# Patient Record
Sex: Male | Born: 1955 | Race: White | Hispanic: No | Smoking: Former smoker
Health system: Southern US, Community
[De-identification: ages and names within clinical notes are randomized; demographics above are authoritative.]

## PROBLEM LIST (undated history)

## (undated) DIAGNOSIS — I700 Atherosclerosis of aorta: Secondary | ICD-10-CM

## (undated) DIAGNOSIS — K219 Gastro-esophageal reflux disease without esophagitis: Secondary | ICD-10-CM

## (undated) DIAGNOSIS — J449 Chronic obstructive pulmonary disease, unspecified: Secondary | ICD-10-CM

## (undated) DIAGNOSIS — I4891 Unspecified atrial fibrillation: Secondary | ICD-10-CM

## (undated) DIAGNOSIS — E7800 Pure hypercholesterolemia, unspecified: Secondary | ICD-10-CM

## (undated) DIAGNOSIS — D126 Benign neoplasm of colon, unspecified: Secondary | ICD-10-CM

## (undated) DIAGNOSIS — I4892 Unspecified atrial flutter: Secondary | ICD-10-CM

## (undated) DIAGNOSIS — Z720 Tobacco use: Secondary | ICD-10-CM

## (undated) HISTORY — DX: Atherosclerosis of aorta: I70.0

## (undated) HISTORY — DX: Benign neoplasm of colon, unspecified: D12.6

## (undated) HISTORY — DX: Pure hypercholesterolemia, unspecified: E78.00

## (undated) HISTORY — DX: Tobacco use: Z72.0

## (undated) HISTORY — DX: Chronic obstructive pulmonary disease, unspecified: J44.9

## (undated) HISTORY — DX: Unspecified atrial fibrillation: I48.91

## (undated) HISTORY — DX: Gastro-esophageal reflux disease without esophagitis: K21.9

## (undated) HISTORY — DX: Unspecified atrial flutter: I48.92

---

## 2018-05-19 ENCOUNTER — Emergency Department (HOSPITAL_COMMUNITY): Payer: BLUE CROSS/BLUE SHIELD

## 2018-05-19 ENCOUNTER — Emergency Department (HOSPITAL_COMMUNITY)
Admission: EM | Admit: 2018-05-19 | Discharge: 2018-05-19 | Disposition: A | Discharge status: Home / Self Care | Payer: BLUE CROSS/BLUE SHIELD | Attending: Emergency Medicine | Admitting: Emergency Medicine

## 2018-05-19 ENCOUNTER — Encounter (HOSPITAL_COMMUNITY): Payer: Self-pay | Admitting: Emergency Medicine

## 2018-05-19 ENCOUNTER — Other Ambulatory Visit: Payer: Self-pay

## 2018-05-19 DIAGNOSIS — N200 Calculus of kidney: Secondary | ICD-10-CM | POA: Insufficient documentation

## 2018-05-19 DIAGNOSIS — R1031 Right lower quadrant pain: Secondary | ICD-10-CM | POA: Diagnosis present

## 2018-05-19 LAB — URINALYSIS, ROUTINE W REFLEX MICROSCOPIC
Bilirubin Urine: NEGATIVE
Glucose, UA: NEGATIVE mg/dL
Ketones, ur: NEGATIVE mg/dL
Leukocytes,Ua: NEGATIVE
Nitrite: NEGATIVE
Protein, ur: NEGATIVE mg/dL
Specific Gravity, Urine: 1.006 (ref 1.005–1.030)
pH: 6 (ref 5.0–8.0)

## 2018-05-19 LAB — COMPREHENSIVE METABOLIC PANEL
ALT: 17 U/L (ref 0–44)
AST: 20 U/L (ref 15–41)
Albumin: 4.3 g/dL (ref 3.5–5.0)
Alkaline Phosphatase: 64 U/L (ref 38–126)
Anion gap: 9 (ref 5–15)
BUN: 20 mg/dL (ref 8–23)
CO2: 27 mmol/L (ref 22–32)
Calcium: 9.2 mg/dL (ref 8.9–10.3)
Chloride: 102 mmol/L (ref 98–111)
Creatinine, Ser: 1.61 mg/dL — ABNORMAL HIGH (ref 0.61–1.24)
GFR calc Af Amer: 52 mL/min — ABNORMAL LOW (ref >60)
GFR calc non Af Amer: 45 mL/min — ABNORMAL LOW (ref >60)
Glucose, Bld: 120 mg/dL — ABNORMAL HIGH (ref 70–99)
Potassium: 3.9 mmol/L (ref 3.5–5.1)
Sodium: 138 mmol/L (ref 135–145)
Total Bilirubin: 1.1 mg/dL (ref 0.3–1.2)
Total Protein: 7.2 g/dL (ref 6.5–8.1)

## 2018-05-19 LAB — CBC
HCT: 49.4 % (ref 39.0–52.0)
Hemoglobin: 15.7 g/dL (ref 13.0–17.0)
MCH: 31.4 pg (ref 26.0–34.0)
MCHC: 31.8 g/dL (ref 30.0–36.0)
MCV: 98.8 fL (ref 80.0–100.0)
Platelets: 244 10*3/uL (ref 150–400)
RBC: 5 MIL/uL (ref 4.22–5.81)
RDW: 14.3 % (ref 11.5–15.5)
WBC: 11.3 10*3/uL — ABNORMAL HIGH (ref 4.0–10.5)
nRBC: 0 % (ref 0.0–0.2)

## 2018-05-19 LAB — LIPASE, BLOOD: Lipase: 31 U/L (ref 11–51)

## 2018-05-19 MED ORDER — TAMSULOSIN HCL 0.4 MG PO CAPS: 0.4000 mg | ORAL_CAPSULE | Freq: Every day | ORAL | 0 refills | Status: DC

## 2018-05-19 MED ORDER — SODIUM CHLORIDE 0.9% FLUSH
3.0000 mL | Freq: Once | INTRAVENOUS | Status: AC
  Administered 2018-05-19: 3 mL via INTRAVENOUS

## 2018-05-19 MED ORDER — SODIUM CHLORIDE 0.9 % IV BOLUS
1000.0000 mL | Freq: Once | INTRAVENOUS | Status: AC
  Administered 2018-05-19: 1000 mL via INTRAVENOUS

## 2018-05-19 MED ORDER — MORPHINE SULFATE (PF) 4 MG/ML IV SOLN
4.0000 mg | Freq: Once | INTRAVENOUS | Status: AC
  Administered 2018-05-19: 4 mg via INTRAVENOUS
  Filled 2018-05-19: qty 1

## 2018-05-19 MED ORDER — ONDANSETRON HCL 4 MG/2ML IJ SOLN
4.0000 mg | Freq: Once | INTRAMUSCULAR | Status: AC
  Administered 2018-05-19: 4 mg via INTRAVENOUS
  Filled 2018-05-19: qty 2

## 2018-05-19 MED ORDER — HYDROMORPHONE HCL 1 MG/ML IJ SOLN
1.0000 mg | Freq: Once | INTRAMUSCULAR | Status: AC
  Administered 2018-05-19: 1 mg via INTRAVENOUS
  Filled 2018-05-19: qty 1

## 2018-05-19 MED ORDER — OXYCODONE-ACETAMINOPHEN 5-325 MG PO TABS: 1.0000 | ORAL_TABLET | Freq: Four times a day (QID) | ORAL | 0 refills | Status: DC | PRN

## 2018-05-19 NOTE — ED Provider Notes (Signed)
[facility]-EMERGENCY DEPT Provider Note   CSN: [id number] Arrival date & time: 05/19/18  9983    History   Chief Complaint Chief Complaint  Patient presents with  . Flank Pain  . Abdominal Pain    HPI Isaac Stephens is a 63 y.o. male.     The history is provided by the patient. No language interpreter was used.  Flank Pain  Associated symptoms include abdominal pain.  Abdominal Pain     63 year old male presenting for evaluation of abdominal pain.  Patient report acute onset of pain to his right flank that started yesterday afternoon while he was driving.  Pain has been constant stabbing pain that radiates to the right lower quadrant.  Rates pain as an 8 out of 10.  Endorse mild chills.  He denies having fever, chest pain, shortness of breath, productive cough, dysuria, hematuria.  Initially he was constipated and took some laxative.  He has a bowel movement pain is still present.  He was concerned for potential appendicitis.  He denies any recent strenuous activities or heavy lifting.  Denies any pain in his testicle or penis.  History reviewed. No pertinent past medical history.  There are no active problems to display for this patient.   History reviewed. No pertinent surgical history.      Home Medications    Prior to Admission medications   Not on File    Family History No family history on file.  Social History Social History   Tobacco Use  . Smoking status: Never Smoker  . Smokeless tobacco: Never Used  Substance Use Topics  . Alcohol use: Never    Frequency: Never  . Drug use: Never     Allergies   Patient has no allergy information on record.   Review of Systems Review of Systems  Gastrointestinal: Positive for abdominal pain.  Genitourinary: Positive for flank pain.  All other systems reviewed and are negative.    Physical Exam Updated Vital Signs BP (!) 154/91 (BP Location: Right Arm)   Pulse 84   Temp 97.6  F (36.4 C) (Oral)   Resp 20   Ht 5\' 9"  (1.753 m)   Wt 68.9 kg   SpO2 94%   BMI 22.45 kg/m   Physical Exam Vitals signs and nursing note reviewed.  Constitutional:      General: He is not in acute distress.    Appearance: He is well-developed.  HENT:     Head: Atraumatic.  Eyes:     Conjunctiva/sclera: Conjunctivae normal.  Neck:     Musculoskeletal: Neck supple.  Cardiovascular:     Rate and Rhythm: Normal rate and regular rhythm.  Pulmonary:     Effort: Pulmonary effort is normal.     Breath sounds: Normal breath sounds.  Abdominal:     General: Abdomen is flat.     Tenderness: There is abdominal tenderness. There is right CVA tenderness. There is no left CVA tenderness.     Hernia: No hernia is present.  Skin:    Findings: No rash.  Neurological:     Mental Status: He is alert.      ED Treatments / Results  Labs (all labs ordered are listed, but only abnormal results are displayed) Labs Reviewed  COMPREHENSIVE METABOLIC PANEL - Abnormal; Notable for the following components:      Result Value   Glucose, Bld 120 (*)    Creatinine, Ser 1.61 (*)    GFR calc non Af Amer 45 (*)  GFR calc Af Amer 52 (*)    All other components within normal limits  CBC - Abnormal; Notable for the following components:   WBC 11.3 (*)    All other components within normal limits  URINALYSIS, ROUTINE W REFLEX MICROSCOPIC - Abnormal; Notable for the following components:   Hgb urine dipstick MODERATE (*)    Bacteria, UA RARE (*)    All other components within normal limits  LIPASE, BLOOD    EKG None  Radiology Ct Renal Stone Study  Result Date: 05/19/2018 CLINICAL DATA:  Right flank pain and right lower quadrant pain. EXAM: CT ABDOMEN AND PELVIS WITHOUT CONTRAST TECHNIQUE: Multidetector CT imaging of the abdomen and pelvis was performed following the standard protocol without IV contrast. COMPARISON:  None. FINDINGS: Lower chest: No acute abnormality. Hepatobiliary: There are  small cysts in the right and left lobes of the liver. Liver parenchyma is otherwise normal. Biliary tree is normal. Pancreas: Unremarkable. No pancreatic ductal dilatation or surrounding inflammatory changes. Spleen: Normal in size without focal abnormality. Adrenals/Urinary Tract: There is mild right hydronephrosis. The ureter is dilated to the level of the right ureterovesicle junction where there is a 4 mm obstructing stone. There is extravasation of urine into the right perinephric space. There is a tiny stone in the upper pole of the right kidney and a tiny stone in the lower pole of the left kidney. Left kidney is otherwise normal. Adrenal glands are normal. Bladder is normal. Stomach/Bowel: Stomach is within normal limits. Appendix appears normal. No evidence of bowel wall thickening, distention, or inflammatory changes. There are a few scattered diverticula in the colon. Vascular/Lymphatic: Aortic atherosclerosis. No enlarged abdominal or pelvic lymph nodes. Reproductive: Prostate is unremarkable. Other: No abdominal wall hernia or abnormality. No abdominopelvic ascites. Musculoskeletal: No acute bone abnormalities. Arthritic changes in both hips. Degenerative disc disease at L3-4 and L4-5. Facet arthritis at L4-5. IMPRESSION: 4 mm stone obstructing the distal right ureter in the right ureterovesicle junction with slight right hydronephrosis and extravasation of urine into the right perinephric space. Tiny bilateral renal calculi. Aortic Atherosclerosis (ICD10-I70.0). Electronically Signed   By: Francene Boyers M.D.   On: 05/19/2018 13:06    Procedures Procedures (including critical care time)  Medications Ordered in ED Medications  sodium chloride flush (NS) 0.9 % injection 3 mL (3 mLs Intravenous Given 05/19/18 1153)  morphine 4 MG/ML injection 4 mg (4 mg Intravenous Given 05/19/18 1152)  ondansetron (ZOFRAN) injection 4 mg (4 mg Intravenous Given 05/19/18 1152)  sodium chloride 0.9 % bolus 1,000 mL (0  mLs Intravenous Stopped 05/19/18 1407)  HYDROmorphone (DILAUDID) injection 1 mg (1 mg Intravenous Given 05/19/18 1351)     Initial Impression / Assessment and Plan / ED Course  I have reviewed the triage vital signs and the nursing notes.  Pertinent labs & imaging results that were available during my care of the patient were reviewed by me and considered in my medical decision making (see chart for details).        BP (!) 154/91 (BP Location: Right Arm)   Pulse 84   Temp 97.6 F (36.4 C) (Oral)   Resp 20   Ht  (1.753 m)   Wt 68.9 kg   SpO2 94%   BMI 22.45 kg/m    Final Clinical Impressions(s) / ED Diagnoses   Final diagnoses:  Kidney stone    ED Discharge Orders         Ordered    oxyCODONE-acetaminophen (PERCOCET) 5-325 MG  tablet  Every 6 hours PRN     05/19/18 1403    tamsulosin (FLOMAX) 0.4 MG CAPS capsule  Daily     05/19/18 1403         11:32 AM Patient complaining of right flank pain.  He does have right CVA tenderness.  Work-up initiated, anticipate abdominal pelvis CT scan for further evaluation.  1:26 PM UA shows moderate hemoglobin acute distress but no evidence of infection.  Impaired renal function with creatinine of 1.61, no prior value for comparison.  WBC 11.3.  CT renal stone study demonstrating 4 mm stone obstructing the distal right ureter in the right UVJ with slight right hydronephrosis and extravasation some urine into the right perinephric space.  Tiny bilateral renal calculi.  This finding is consistent with patient's current pain.  Will provide additional pain medication as he appears uncomfortable.  Urine strainer provided.  Care discussed with Dr. Juleen China.   2:07 PM Pt discharge home with urine strainer, percocet and flomax.  Urology referral given.  Return precaution given.    Fayrene Helper, PA-C 05/19/18 1407    Raeford Razor, MD 05/20/18 [phone]

## 2018-05-19 NOTE — ED Triage Notes (Signed)
Patient here from home with complaints of right flank pain radiating around to right abdomen that started yesterday. Denies n/v.

## 2018-05-19 NOTE — Discharge Instructions (Signed)
You have been diagnosed with a 44mm kidney stone on the right side.  Please use a urine strainer each time you urinate to capture the stone and bring it to urology office for analysis and further care.  Take percocet as needed for pain, take flomax to help improves likelihood of passing your kidney stone.  Return if you have any concerns.  Stay hydrated.

## 2018-05-21 NOTE — ED Notes (Signed)
Patient needed referral to [facility] for an urology appointment-[facility] was out of network per insurance-OK given via Dr Claretta Fraise gave writer permission to call in referral/information for patient

## 2018-12-30 ENCOUNTER — Other Ambulatory Visit: Payer: Self-pay | Admitting: Family Medicine

## 2018-12-30 DIAGNOSIS — Z87891 Personal history of nicotine dependence: Secondary | ICD-10-CM

## 2018-12-30 DIAGNOSIS — Z122 Encounter for screening for malignant neoplasm of respiratory organs: Secondary | ICD-10-CM

## 2019-01-06 ENCOUNTER — Ambulatory Visit
Admission: RE | Admit: 2019-01-06 | Discharge: 2019-01-06 | Disposition: A | Discharge status: Home / Self Care | Payer: Medicare Other | Source: Ambulatory Visit | Attending: Family Medicine | Admitting: Family Medicine

## 2019-01-06 DIAGNOSIS — Z87891 Personal history of nicotine dependence: Secondary | ICD-10-CM

## 2019-01-06 DIAGNOSIS — Z122 Encounter for screening for malignant neoplasm of respiratory organs: Secondary | ICD-10-CM

## 2019-12-28 ENCOUNTER — Other Ambulatory Visit: Payer: Self-pay | Admitting: Family Medicine

## 2019-12-28 DIAGNOSIS — Z87891 Personal history of nicotine dependence: Secondary | ICD-10-CM

## 2019-12-28 DIAGNOSIS — Z122 Encounter for screening for malignant neoplasm of respiratory organs: Secondary | ICD-10-CM

## 2020-01-12 ENCOUNTER — Ambulatory Visit
Admission: RE | Admit: 2020-01-12 | Discharge: 2020-01-12 | Disposition: A | Discharge status: Home / Self Care | Payer: Medicare Other | Source: Ambulatory Visit | Attending: Family Medicine | Admitting: Family Medicine

## 2020-01-12 DIAGNOSIS — Z87891 Personal history of nicotine dependence: Secondary | ICD-10-CM

## 2020-01-12 DIAGNOSIS — Z122 Encounter for screening for malignant neoplasm of respiratory organs: Secondary | ICD-10-CM

## 2020-02-21 IMAGING — CT CT RENAL STONE PROTOCOL
2 of 4 series · 16 of 46 positions shown, 18 images · non-contrast
Comparison: None.

CLINICAL DATA: Right flank pain and right lower quadrant pain.

EXAM:
CT ABDOMEN AND PELVIS WITHOUT CONTRAST
TECHNIQUE: Multidetector CT imaging of the abdomen and pelvis was performed
following the standard protocol without IV contrast.

[Series 2: axial st · axial · 0.65mm/px · z∈[+981,+1326]mm · 13 of 79 slices shown, 15 images]
[im 5/79  soft-tissue]
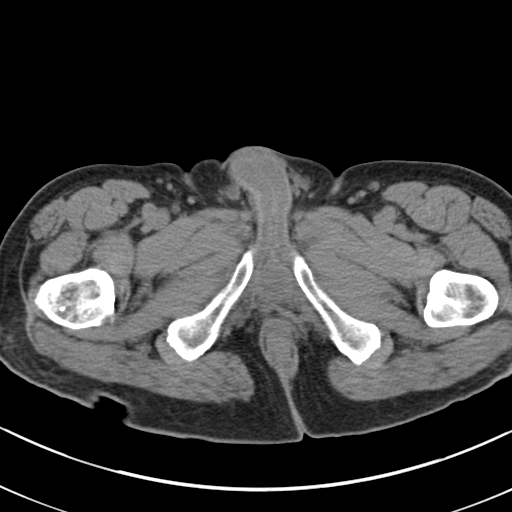
[im 5/79  bone]
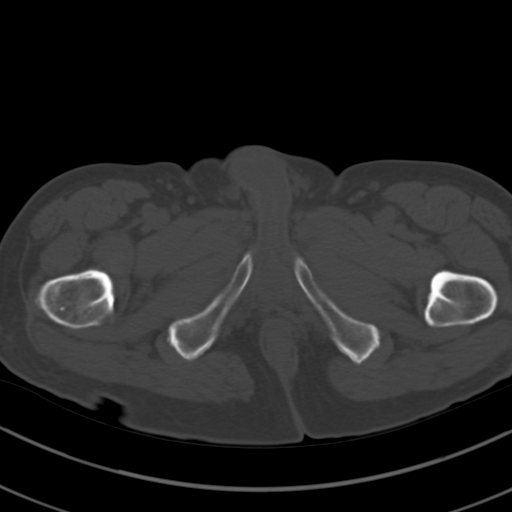
[im 9/79  soft-tissue]
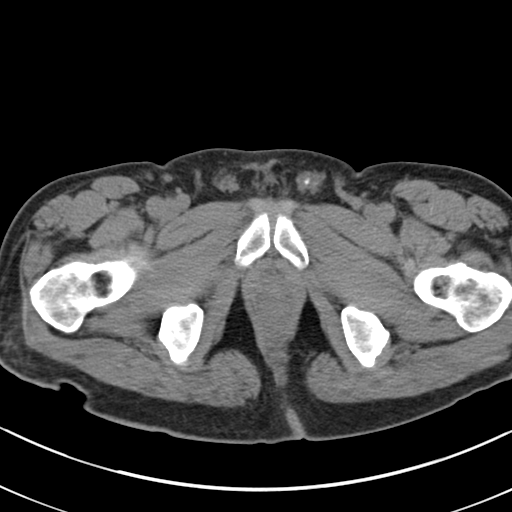
[im 18/79  soft-tissue]
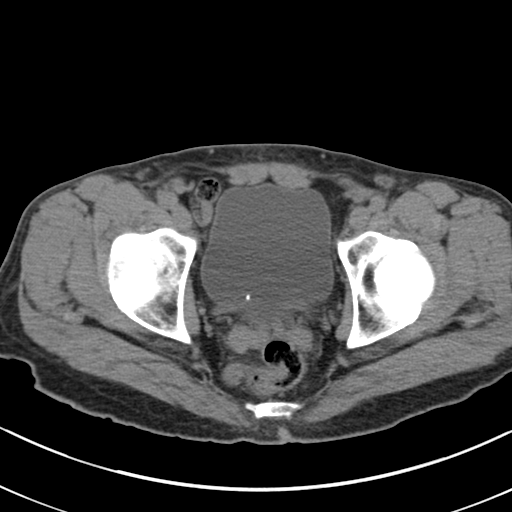
[im 22/79  soft-tissue]
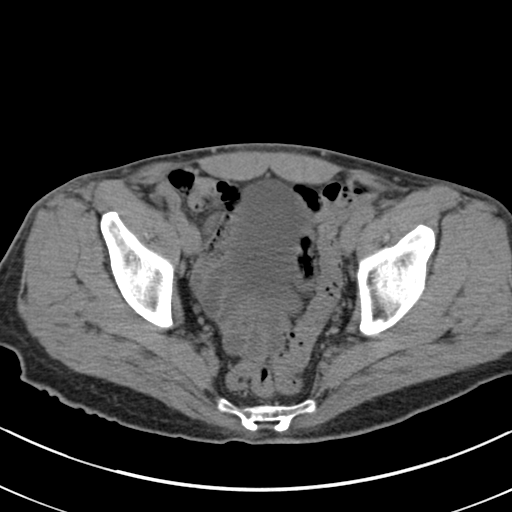
[im 27/79  soft-tissue]
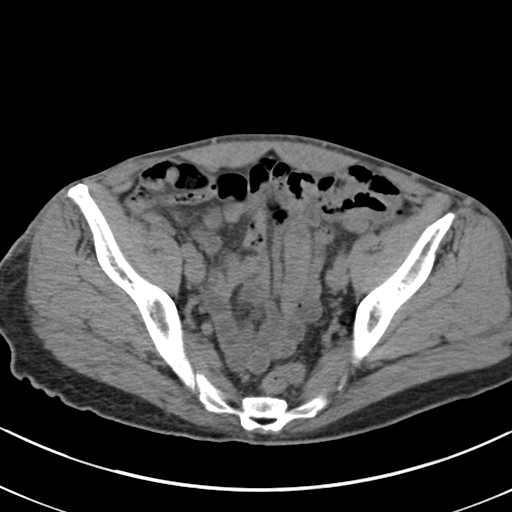
[im 35/79  soft-tissue]
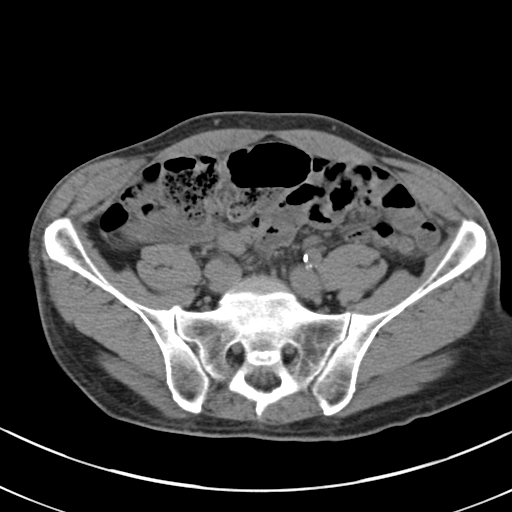
[im 40/79  soft-tissue]
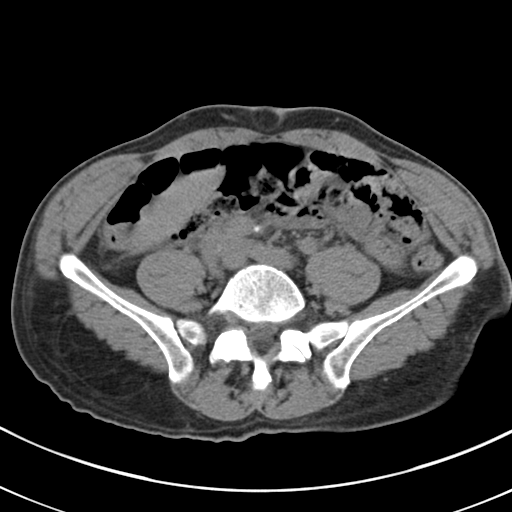
[im 44/79  soft-tissue]
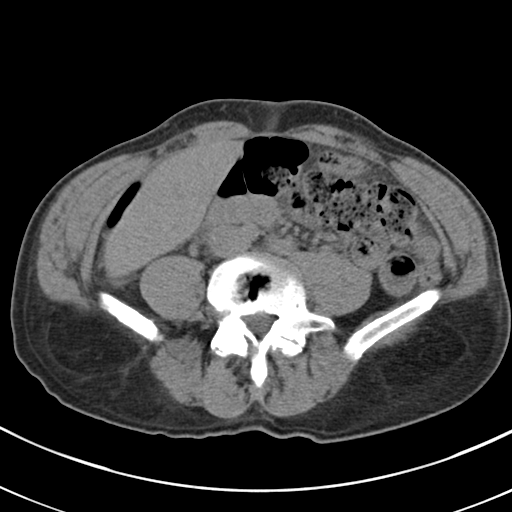
[im 53/79  soft-tissue]
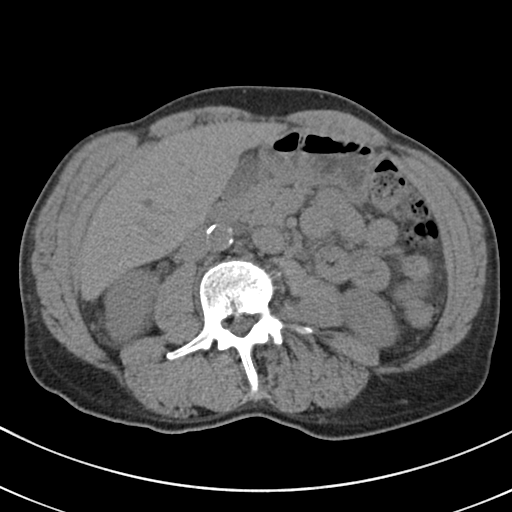
[im 53/79  bone]
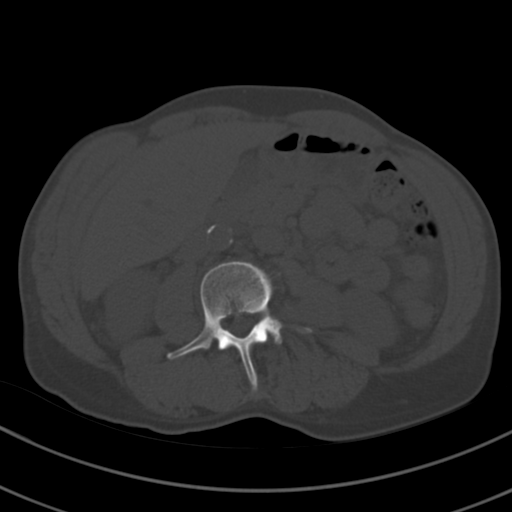
[im 57/79  soft-tissue]
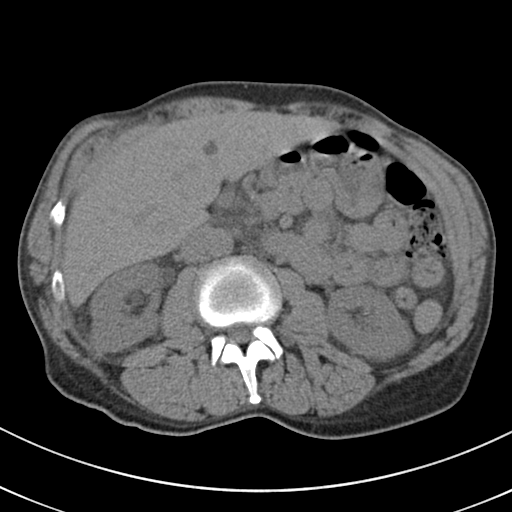
[im 61/79  soft-tissue]
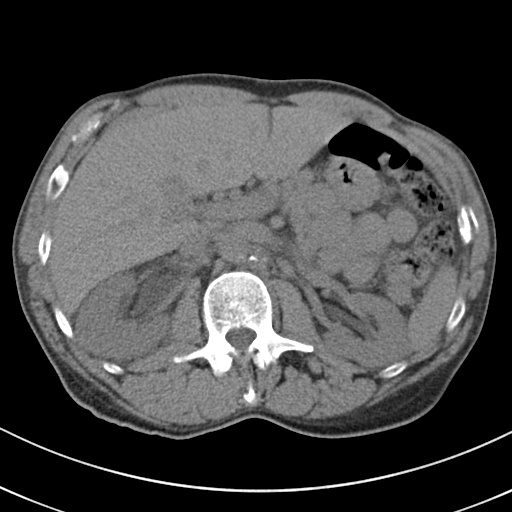
[im 70/79  soft-tissue]
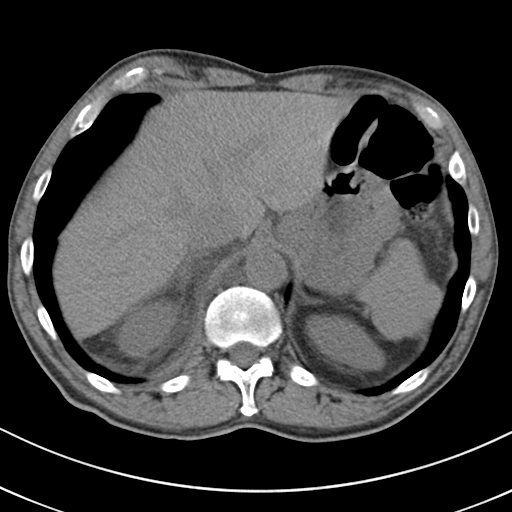
[im 74/79  soft-tissue]
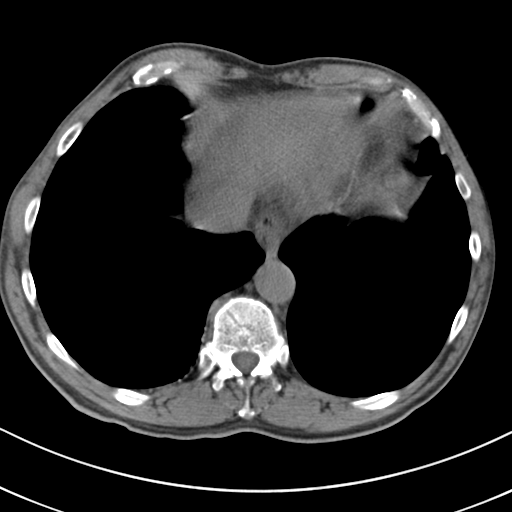

[Series 5: coronal · coronal · 0.62mm/px · 3 of 113 slices shown]
[im 38/113  soft-tissue]
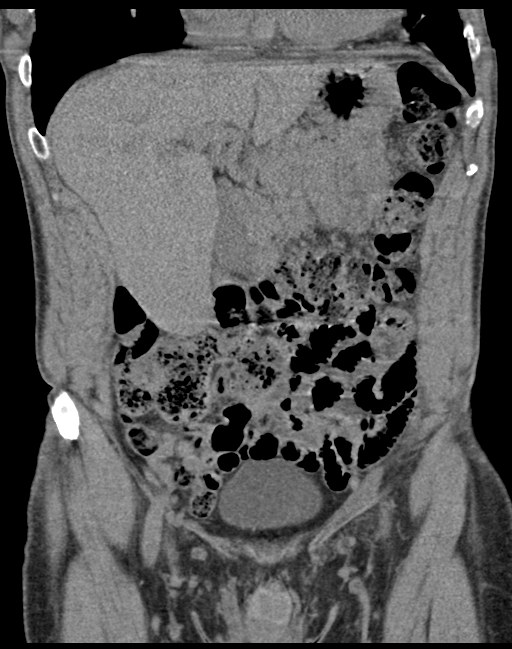
[im 50/113  soft-tissue]
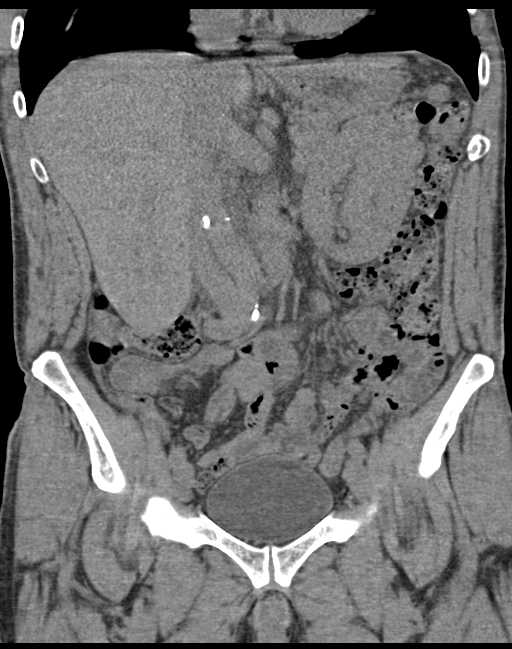
[im 63/113  soft-tissue]
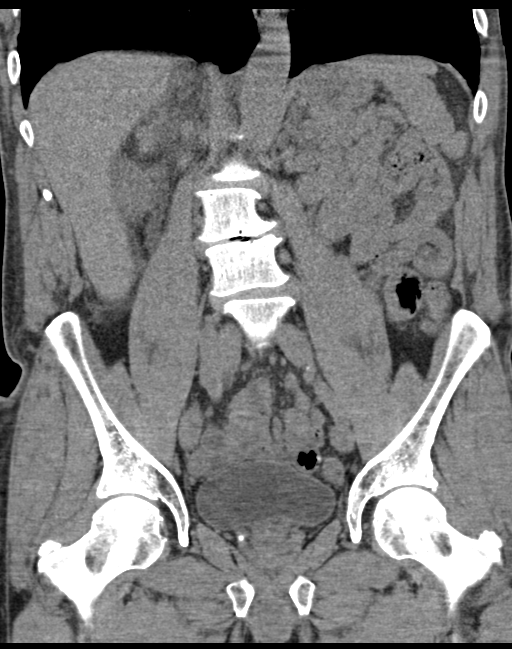

[16 of 46 positions shown; findings below may reference images not displayed]

FINDINGS: Lower chest: No acute abnormality.

Hepatobiliary: There are small cysts in the right and left lobes of
the liver. Liver parenchyma is otherwise normal. Biliary tree is
normal.

Pancreas: Unremarkable. No pancreatic ductal dilatation or
surrounding inflammatory changes.

Spleen: Normal in size without focal abnormality.

Adrenals/Urinary Tract: There is mild right hydronephrosis. The
ureter is dilated to the level of the right ureterovesicle junction
where there is a 4 mm obstructing stone. There is extravasation of
urine into the right perinephric space. There is a tiny stone in the
upper pole of the right kidney and a tiny stone in the lower pole of
the left kidney. Left kidney is otherwise normal. Adrenal glands are
normal. Bladder is normal.

Stomach/Bowel: Stomach is within normal limits. Appendix appears
normal. No evidence of bowel wall thickening, distention, or
inflammatory changes. There are a few scattered diverticula in the
colon.

Vascular/Lymphatic: Aortic atherosclerosis. No enlarged abdominal or
pelvic lymph nodes.

Reproductive: Prostate is unremarkable.

Other: No abdominal wall hernia or abnormality. No abdominopelvic
ascites.

Musculoskeletal: No acute bone abnormalities. Arthritic changes in
both hips. Degenerative disc disease at L3-4 and L4-5. Facet
arthritis at L4-5.
IMPRESSION: 4 mm stone obstructing the distal right ureter in the right
ureterovesicle junction with slight right hydronephrosis and
extravasation of urine into the right perinephric space.

Tiny bilateral renal calculi.

Aortic Atherosclerosis ([id number]).

## 2021-02-13 ENCOUNTER — Other Ambulatory Visit: Payer: Self-pay | Admitting: Family Medicine

## 2021-02-13 DIAGNOSIS — Z87891 Personal history of nicotine dependence: Secondary | ICD-10-CM

## 2021-02-13 DIAGNOSIS — Z122 Encounter for screening for malignant neoplasm of respiratory organs: Secondary | ICD-10-CM

## 2021-03-13 ENCOUNTER — Ambulatory Visit
Admission: RE | Admit: 2021-03-13 | Discharge: 2021-03-13 | Disposition: A | Discharge status: Home / Self Care | Payer: Medicare Other | Source: Ambulatory Visit | Attending: Family Medicine | Admitting: Family Medicine

## 2021-03-13 ENCOUNTER — Other Ambulatory Visit: Payer: Self-pay

## 2021-03-13 DIAGNOSIS — Z87891 Personal history of nicotine dependence: Secondary | ICD-10-CM

## 2021-03-13 DIAGNOSIS — Z122 Encounter for screening for malignant neoplasm of respiratory organs: Secondary | ICD-10-CM

## 2021-07-24 ENCOUNTER — Other Ambulatory Visit (HOSPITAL_COMMUNITY): Payer: Self-pay | Admitting: Internal Medicine

## 2021-07-24 DIAGNOSIS — D6859 Other primary thrombophilia: Secondary | ICD-10-CM

## 2021-07-24 DIAGNOSIS — I480 Paroxysmal atrial fibrillation: Secondary | ICD-10-CM

## 2021-08-05 ENCOUNTER — Ambulatory Visit (HOSPITAL_COMMUNITY)
Admission: RE | Admit: 2021-08-05 | Discharge: 2021-08-05 | Disposition: A | Discharge status: Home / Self Care | Payer: 59 | Source: Ambulatory Visit | Attending: Internal Medicine | Admitting: Internal Medicine

## 2021-08-05 DIAGNOSIS — D6859 Other primary thrombophilia: Secondary | ICD-10-CM

## 2021-08-05 DIAGNOSIS — I4891 Unspecified atrial fibrillation: Secondary | ICD-10-CM | POA: Diagnosis not present

## 2021-08-05 DIAGNOSIS — I480 Paroxysmal atrial fibrillation: Secondary | ICD-10-CM | POA: Diagnosis not present

## 2021-08-05 LAB — ECHOCARDIOGRAM COMPLETE
AR max vel: 2.71 cm2
AV Peak grad: 4.9 mmHg
Ao pk vel: 1.11 m/s
S' Lateral: 3.1 cm

## 2022-10-14 ENCOUNTER — Other Ambulatory Visit: Payer: Self-pay | Admitting: Family Medicine

## 2022-10-14 DIAGNOSIS — Z720 Tobacco use: Secondary | ICD-10-CM

## 2022-11-04 ENCOUNTER — Ambulatory Visit
Admission: RE | Admit: 2022-11-04 | Discharge: 2022-11-04 | Disposition: A | Discharge status: Home / Self Care | Payer: 59 | Source: Ambulatory Visit | Attending: Family Medicine | Admitting: Family Medicine

## 2022-11-04 DIAGNOSIS — Z720 Tobacco use: Secondary | ICD-10-CM

## 2023-12-23 NOTE — Progress Notes (Unsigned)
 Cardiology Office Note:    Date:  12/23/2023   ID:  Isaac Stephens, DOB 1955/10/18, MRN [id number]  PCP:  Jess Llano, MD   [facility] Providers Cardiologist:  None { Click to update primary MD,subspecialty MD or APP then REFRESH:1}    Referring MD: Jess Llano, MD   No chief complaint on file. ***  History of Present Illness:    Isaac Stephens is a 68 y.o. male seen at the request of Mabel Pleas PA for evaluation of persistent Atrial fibrillation. He has a history of tobacco abuse, COPD, HLD.   Past Medical History:  Diagnosis Date   Adenomatous polyp of colon    Aortic atherosclerosis    Atrial fib/flutter, transient (HCC)    Atrial fibrillation (HCC)    COPD (chronic obstructive pulmonary disease) (HCC)    GERD (gastroesophageal reflux disease)    Hypercholesteremia    Tobacco abuse    Tobacco abuse     No past surgical history on file.  Current Medications: No outpatient medications have been marked as taking for the 12/25/23 encounter (Appointment) with [place], Japleen Tornow M, MD.     Allergies:   Patient has no known allergies.   Social History   Socioeconomic History   Marital status: Divorced    Spouse name: Not on file   Number of children: Not on file   Years of education: Not on file   Highest education level: Not on file  Occupational History   Not on file  Tobacco Use   Smoking status: Every Day    Types: Cigarettes   Smokeless tobacco: Never  Substance and Sexual Activity   Alcohol use: Yes   Drug use: Yes   Sexual activity: Not on file  Other Topics Concern   Not on file  Social History Narrative   Not on file   Social Drivers of Health   Financial Resource Strain: Not on file  Food Insecurity: Not on file  Transportation Needs: Not on file  Physical Activity: Not on file  Stress: Not on file  Social Connections: Not on file     Family History: The patient's ***family history is not on file.  ROS:   Please  see the history of present illness.    *** All other systems reviewed and are negative.  EKGs/Labs/Other Studies Reviewed:    The following studies were reviewed today: Echo 08/05/21: IMPRESSIONS     1. Septal flattening consistent with volume/pressure overload.. Left  ventricular ejection fraction, by estimation, is 55%. The left ventricle  has normal function. The left ventricle has no regional wall motion  abnormalities. Left ventricular diastolic  parameters are indeterminate.   2. Right ventricular systolic function is moderately reduced. The right  ventricular size is mildly enlarged. Mildly increased right ventricular  wall thickness. There is mildly elevated pulmonary artery systolic  pressure.   3. Right atrial size was moderately dilated.   4. The mitral valve is normal in structure. Trivial mitral valve  regurgitation.   5. The aortic valve is tricuspid. Aortic valve regurgitation is not  visualized.   6. The inferior vena cava is dilated in size with <50% respiratory  variability, suggesting right atrial pressure of 15 mmHg.        Recent Labs: No results found for requested labs within last 365 days.  Recent Lipid Panel No results found for: CHOL, TRIG, HDL, CHOLHDL, VLDL, LDLCALC, LDLDIRECT   Risk Assessment/Calculations:   {Does this patient have ATRIAL FIBRILLATION?[phone]}  No  BP recorded.  {Refresh Note OR Click here to enter BP  :1}***         Physical Exam:    VS:  There were no vitals taken for this visit.    Wt Readings from Last 3 Encounters:  05/19/18 152 lb (68.9 kg)     GEN: *** Well nourished, well developed in no acute distress HEENT: Normal NECK: No JVD; No carotid bruits LYMPHATICS: No lymphadenopathy CARDIAC: ***RRR, no murmurs, rubs, gallops RESPIRATORY:  Clear to auscultation without rales, wheezing or rhonchi  ABDOMEN: Soft, non-tender, non-distended MUSCULOSKELETAL:  No edema; No deformity  SKIN: Warm and  dry NEUROLOGIC:  Alert and oriented x 3 PSYCHIATRIC:  Normal affect   ASSESSMENT:    No diagnosis found. PLAN:    In order of problems listed above:  ***      {Are you ordering a CV Procedure (e.g. stress test, cath, DCCV, TEE, etc)?   Press F2        :[id number]}    Medication Adjustments/Labs and Tests Ordered: Current medicines are reviewed at length with the patient today.  Concerns regarding medicines are outlined above.  No orders of the defined types were placed in this encounter.  No orders of the defined types were placed in this encounter.   There are no Patient Instructions on file for this visit.   Signed, Jaline Pincock [place], MD  12/23/2023 1:03 PM    [facility]

## 2023-12-25 ENCOUNTER — Ambulatory Visit (INDEPENDENT_AMBULATORY_CARE_PROVIDER_SITE_OTHER)

## 2023-12-25 ENCOUNTER — Ambulatory Visit: Attending: Cardiology | Admitting: Cardiology

## 2023-12-25 ENCOUNTER — Encounter: Payer: Self-pay | Admitting: Cardiology

## 2023-12-25 VITALS — BP 132/68 | HR 78 | Ht 69.0 in | Wt 173.4 lb

## 2023-12-25 DIAGNOSIS — I4891 Unspecified atrial fibrillation: Secondary | ICD-10-CM

## 2023-12-25 DIAGNOSIS — E7800 Pure hypercholesterolemia, unspecified: Secondary | ICD-10-CM | POA: Diagnosis not present

## 2023-12-25 DIAGNOSIS — Z720 Tobacco use: Secondary | ICD-10-CM

## 2023-12-25 DIAGNOSIS — R9431 Abnormal electrocardiogram [ECG] [EKG]: Secondary | ICD-10-CM

## 2023-12-25 DIAGNOSIS — I10 Essential (primary) hypertension: Secondary | ICD-10-CM

## 2023-12-25 NOTE — Patient Instructions (Addendum)
 Medication Instructions:  Stop Metoprolol Continue all other medications *If you need a refill on your cardiac medications before your next appointment, please call your pharmacy*  Lab Work: None ordered  Testing/Procedures: 14 day Heart Monitor  will be mailed to your home with instructions  Follow-Up: At [facility], you and your health needs are our priority.  As part of our continuing mission to provide you with exceptional heart care, our providers are all part of one team.  This team includes your primary Cardiologist (physician) and Advanced Practice Providers or APPs (Physician Assistants and Nurse Practitioners) who all work together to provide you with the care you need, when you need it.  Your next appointment:  1 year   Call in May to schedule Oct appointment     Provider:  Dr.Jordan   We recommend signing up for the patient portal called MyChart.  Sign up information is provided on this After Visit Summary.  MyChart is used to connect with patients for Virtual Visits (Telemedicine).  Patients are able to view lab/test results, encounter notes, upcoming appointments, etc.  Non-urgent messages can be sent to your provider as well.   To learn more about what you can do with MyChart, go to [email or web address].

## 2023-12-25 NOTE — Progress Notes (Unsigned)
 Enrolled patient for a 14 day Zio XT  monitor to be mailed to patients home

## 2024-01-08 ENCOUNTER — Other Ambulatory Visit: Payer: Self-pay | Admitting: Physician Assistant

## 2024-01-08 DIAGNOSIS — Z122 Encounter for screening for malignant neoplasm of respiratory organs: Secondary | ICD-10-CM

## 2024-01-21 ENCOUNTER — Ambulatory Visit: Payer: Self-pay | Admitting: Cardiology

## 2024-01-21 DIAGNOSIS — I4891 Unspecified atrial fibrillation: Secondary | ICD-10-CM

## 2024-02-08 ENCOUNTER — Other Ambulatory Visit: Payer: Self-pay

## 2024-02-08 NOTE — Telephone Encounter (Signed)
 Patient returned RN's call regarding results.

## 2024-02-10 ENCOUNTER — Ambulatory Visit
Admission: RE | Admit: 2024-02-10 | Discharge: 2024-02-10 | Disposition: A | Discharge status: Home / Self Care | Source: Ambulatory Visit | Attending: Physician Assistant | Admitting: Physician Assistant

## 2024-02-10 DIAGNOSIS — Z122 Encounter for screening for malignant neoplasm of respiratory organs: Secondary | ICD-10-CM

## 2024-03-21 ENCOUNTER — Ambulatory Visit: Payer: Self-pay | Admitting: Internal Medicine

## 2024-03-21 ENCOUNTER — Ambulatory Visit (INDEPENDENT_AMBULATORY_CARE_PROVIDER_SITE_OTHER): Admitting: Internal Medicine

## 2024-03-21 ENCOUNTER — Encounter: Payer: Self-pay | Admitting: Internal Medicine

## 2024-03-21 VITALS — BP 138/87 | HR 71 | Temp 97.7°F | Ht 69.0 in | Wt 185.0 lb

## 2024-03-21 DIAGNOSIS — J449 Chronic obstructive pulmonary disease, unspecified: Secondary | ICD-10-CM | POA: Diagnosis not present

## 2024-03-21 DIAGNOSIS — Z87891 Personal history of nicotine dependence: Secondary | ICD-10-CM | POA: Diagnosis not present

## 2024-03-21 LAB — CBC WITH DIFFERENTIAL/PLATELET
Basophils Absolute: 0.1 K/uL (ref 0.0–0.1)
Basophils Relative: 1.1 % (ref 0.0–3.0)
Eosinophils Absolute: 0.2 K/uL (ref 0.0–0.7)
Eosinophils Relative: 3.5 % (ref 0.0–5.0)
HCT: 45.2 % (ref 39.0–52.0)
Hemoglobin: 14.9 g/dL (ref 13.0–17.0)
Lymphocytes Relative: 20.8 % (ref 12.0–46.0)
Lymphs Abs: 1.3 K/uL (ref 0.7–4.0)
MCHC: 33 g/dL (ref 30.0–36.0)
MCV: 94.6 fl (ref 78.0–100.0)
Monocytes Absolute: 0.5 K/uL (ref 0.1–1.0)
Monocytes Relative: 8.1 % (ref 3.0–12.0)
Neutro Abs: 4.1 K/uL (ref 1.4–7.7)
Neutrophils Relative %: 66.5 % (ref 43.0–77.0)
Platelets: 228 K/uL (ref 150.0–400.0)
RBC: 4.77 Mil/uL (ref 4.22–5.81)
RDW: 14.9 % (ref 11.5–15.5)
WBC: 6.2 K/uL (ref 4.0–10.5)

## 2024-03-21 LAB — BASIC METABOLIC PANEL WITH GFR
BUN: 21 mg/dL (ref 6–23)
CO2: 31 meq/L (ref 19–32)
Calcium: 9.1 mg/dL (ref 8.4–10.5)
Chloride: 103 meq/L (ref 96–112)
Creatinine, Ser: 1.04 mg/dL (ref 0.40–1.50)
GFR: 72.77 mL/min
Glucose, Bld: 75 mg/dL (ref 70–99)
Potassium: 4.9 meq/L (ref 3.5–5.1)
Sodium: 141 meq/L (ref 135–145)

## 2024-03-21 NOTE — Assessment & Plan Note (Addendum)
 Quit smoking Oct 2025 - [facility]  02/10/24 mod emphysema  - Allergy screen 03/21/2024 >  Eos 0.2 / Alpha one AT phenotype pending  - 03/21/2024  After extensive coaching inhaler device,  effectiveness =   80% hfa > continue symbicort 80 pending pfts   His copd is relatively mild clinically and wasn't using his symbicort effectively at baseline so plan will be to bring him back at 3 months for full pfts and decide then whether doe still limiting, if so how it does or does not match up with pfts, and see in in meantime if any tendency to AECOP in which case consider escalation to breztri.   Discussed in detail all the  indications, usual  risks and alternatives  relative to the benefits with patient who agrees to proceed with Rx as outlined.    Re SABA :  I spent extra time with pt today reviewing appropriate use of albuterol for prn use on exertion with the following points: 1) saba is for relief of sob that does not improve by walking a slower pace or resting but rather if the pt does not improve after trying this first. 2) If the pt is convinced, as many are, that saba helps recover from activity faster then it's easy to tell if this is the case by re-challenging : ie stop, take the inhaler, then p 5 minutes try the exact same activity (intensity of workload) that just caused the symptoms and see if they are substantially diminished or not after saba 3) if there is an activity that reproducibly causes the symptoms, try the saba 15 min before the activity on alternate days   If in fact the saba really does help, then fine to continue to use it prn but advised may need to look closer at the maintenance regimen (for now symbicort )  being used to achieve better control of airways disease with exertion.      Each maintenance medication was reviewed in detail including emphasizing most importantly the difference between maintenance and prns and under what circumstances the prns are to be triggered using an  action plan format where appropriate.  Total time for H and P, chart review, counseling, reviewing hfa  device(s) and generating customized AVS unique to this office visit / same day charting = 50 min new pt eval

## 2024-03-21 NOTE — Progress Notes (Signed)
 Called and spoke to pt - advised of lab results per Dr. Darlean. Pt verbalized understanding, NFN.  Pt also requested inhaler refill. Pt states this is being prescribed by [facility]. Pt was advised to seek the refill from their office, as he confirmed that he is still being followed by them.

## 2024-03-21 NOTE — Progress Notes (Signed)
 "  Isaac Stephens, male    DOB: 1955/08/06   MRN: [id number]   Brief patient profile:  89 yowm  quit smoking Oct 2025  referred to pulmonary clinic 03/21/2024 by Dr Sheldon  for doe x [id number] assoc mod emphysema  on LDSCT 02/10/24    Pt not previously seen by PCCM service.    History of Present Illness  03/21/2024  Pulmonary/ 1st office eval/Adiah Guereca symbicort  Chief Complaint  Patient presents with   Consult   Shortness of Breath    Walking. Started a year ago.   Dyspnea:  slt better / somewhat slow on flat surfaces better with symbicort  80  Cough: none - was having having smoker's hack but much better  Sleep: flat bed with 2 pillows no resp  SABA use: 2-3 days 02 use: none  LDSCT:q November   No obvious day to day or daytime pattern/variability or assoc excess/ purulent sputum or mucus plugs or hemoptysis or cp or chest tightness, subjective wheeze or overt sinus or hb symptoms.    Also denies any obvious fluctuation of symptoms with weather or environmental changes or other aggravating or alleviating factors except as outlined above   No unusual exposure hx or h/o childhood pna/ asthma or knowledge of premature birth.  Current Allergies, Complete Past Medical History, Past Surgical History, Family History, and Social History were reviewed in [employer] record.  ROS  The following are not active complaints unless bolded Hoarseness, sore throat, dysphagia, dental problems, itching, sneezing,  nasal congestion or discharge of excess mucus or purulent secretions, ear ache,   fever, chills, sweats, unintended wt loss or wt gain, classically pleuritic or exertional cp,  orthopnea pnd or arm/hand swelling  or leg swelling, presyncope, palpitations, abdominal pain, anorexia, nausea, vomiting, diarrhea  or change in bowel habits or change in bladder habits, change in stools or change in urine, dysuria, hematuria,  rash, arthralgias, visual complaints, headache, numbness,  weakness or ataxia or problems with walking or coordination,  change in mood or  memory.             Outpatient Medications Prior to Visit  Medication Sig Dispense Refill   amLODipine (NORVASC) 10 MG tablet Take 10 mg by mouth daily.     Multiple Vitamins-Minerals (CENTRUM SILVER PO) Take 1 tablet by mouth daily.     omeprazole (PRILOSEC) 20 MG capsule Take 20 mg by mouth every morning.     rosuvastatin (CRESTOR) 5 MG tablet Take 5 mg by mouth daily.     SYMBICORT 80-4.5 MCG/ACT inhaler Inhale 2 puffs into the lungs 2 (two) times daily.     albuterol (VENTOLIN HFA) 108 (90 Base) MCG/ACT inhaler SMARTSIG:2 Puff(s) By Mouth Every 4 Hours     No facility-administered medications prior to visit.        Objective:     BP (!) 143/87   Pulse 71   Temp 97.7 F (36.5 C) (Oral)   Ht 5' 9 (1.753 m)   Wt 185 lb (83.9 kg)   SpO2 92% Comment: RA  BMI 27.32 kg/m   SpO2: 92 % (RA)  pleasant  amb wm nad    HEENT : Oropharynx  clear   Nasal turbinates nl    NECK :  without  apparent JVD/ palpable Nodes/TM    LUNGS: no acc muscle use,  Mild barrel  contour chest wall with bilateral  Distant bs s audible wheeze and  without cough on insp or exp maneuvers  and mild  Hyperresonant  to  percussion bilaterally     CV:  RRR  no s3 or murmur or increase in P2, and no edema   ABD:  soft and nontender   MS:  Nl gait/ ext warm without deformities Or obvious joint restrictions  calf tenderness, cyanosis or clubbing     SKIN: warm and dry without lesions    NEURO:  alert, approp, nl sensorium with  no motor or cerebellar deficits apparent.        Assessment   Assessment & Plan COPD mixed type (HCC) Quit smoking Oct 2025 - LDSCT  02/10/24 mod emphysema  - Allergy screen 03/21/2024 >  Eos 0.2 / Alpha one AT phenotype pending  - 03/21/2024  After extensive coaching inhaler device,  effectiveness =   80% hfa > continue symbicort 80 pending pfts   His copd is relatively mild clinically  and wasn't using his symbicort effectively at baseline so plan will be to bring him back at 3 months for full pfts and decide then whether doe still limiting, if so how it does or does not match up with pfts, and see in in meantime if any tendency to AECOP in which case consider escalation to breztri.   Discussed in detail all the  indications, usual  risks and alternatives  relative to the benefits with patient who agrees to proceed with Rx as outlined.    Re SABA :  I spent extra time with pt today reviewing appropriate use of albuterol for prn use on exertion with the following points: 1) saba is for relief of sob that does not improve by walking a slower pace or resting but rather if the pt does not improve after trying this first. 2) If the pt is convinced, as many are, that saba helps recover from activity faster then it's easy to tell if this is the case by re-challenging : ie stop, take the inhaler, then p 5 minutes try the exact same activity (intensity of workload) that just caused the symptoms and see if they are substantially diminished or not after saba 3) if there is an activity that reproducibly causes the symptoms, try the saba 15 min before the activity on alternate days   If in fact the saba really does help, then fine to continue to use it prn but advised may need to look closer at the maintenance regimen (for now symbicort )  being used to achieve better control of airways disease with exertion.      Each maintenance medication was reviewed in detail including emphasizing most importantly the difference between maintenance and prns and under what circumstances the prns are to be triggered using an action plan format where appropriate.  Total time for H and P, chart review, counseling, reviewing hfa  device(s) and generating customized AVS unique to this office visit / same day charting = 50 min new pt eval         AVS  Patient Instructions  Plan A = Automatic = Always=     Symbicort 80 Take 2 puffs first thing in am and then another 2 puffs about 12 hours later.    Work on inhaler technique:  relax and gently blow all the way out then take a nice smooth full deep breath back in, triggering the inhaler at same time you start breathing in.  Hold breath in for at least  5 seconds if you can. Blow out symbicort  thru nose. Rinse and gargle  with water when done.  If mouth or throat bother you at all,  try brushing teeth/gums/tongue with arm and hammer toothpaste/ make a slurry and gargle and spit out.   >>>  Remember how golfers warm up by taking practice swings - do this with an empty inhaler    Plan B = Backup (to supplement plan A, not to replace it) Use your albuterol inhaler as a rescue medication to be used if you can't catch your breath by resting or slowing your pace  or doing a relaxed purse lip breathing pattern.  - The less you use it, the better it will work when you need it. - Ok to use the inhaler up to 2 puffs  every 4 hours if you must but call for appointment if use goes up over your usual need - Don't leave home without it !!  (think of it like the spare tire or starter fluid for your car)   Also  Ok to try albuterol 15 min before an activity (on alternating days)  that you know would usually make you short of breath and see if it makes any difference and if makes none then don't take albuterol after activity unless you can't catch your breath as this means it's the resting that helps, not the albuterol.  Please remember to go to the lab department   for your tests - we will call you with the results when they are available.      Please schedule a follow up visit in 3 months but call sooner if needed PFTs            Ozell America, MD 03/21/2024      "

## 2024-03-21 NOTE — Patient Instructions (Addendum)
 Plan A = Automatic = Always=    Symbicort 80 Take 2 puffs first thing in am and then another 2 puffs about 12 hours later.    Work on inhaler technique:  relax and gently blow all the way out then take a nice smooth full deep breath back in, triggering the inhaler at same time you start breathing in.  Hold breath in for at least  5 seconds if you can. Blow out symbicort  thru nose. Rinse and gargle with water when done.  If mouth or throat bother you at all,  try brushing teeth/gums/tongue with arm and hammer toothpaste/ make a slurry and gargle and spit out.   >>>  Remember how golfers warm up by taking practice swings - do this with an empty inhaler    Plan B = Backup (to supplement plan A, not to replace it) Use your albuterol inhaler as a rescue medication to be used if you can't catch your breath by resting or slowing your pace  or doing a relaxed purse lip breathing pattern.  - The less you use it, the better it will work when you need it. - Ok to use the inhaler up to 2 puffs  every 4 hours if you must but call for appointment if use goes up over your usual need - Don't leave home without it !!  (think of it like the spare tire or starter fluid for your car)   Also  Ok to try albuterol 15 min before an activity (on alternating days)  that you know would usually make you short of breath and see if it makes any difference and if makes none then don't take albuterol after activity unless you can't catch your breath as this means it's the resting that helps, not the albuterol.  Please remember to go to the lab department   for your tests - we will call you with the results when they are available.      Please schedule a follow up visit in 3 months but call sooner if needed PFTs

## 2024-03-23 LAB — ALPHA-1-ANTITRYPSIN PHENOTYP: A-1 Antitrypsin: 159 mg/dL (ref 101–187)

## 2024-07-11 ENCOUNTER — Ambulatory Visit: Admitting: Internal Medicine

## 2024-07-11 ENCOUNTER — Encounter
